# Patient Record
Sex: Female | Born: 1985 | Race: White | Hispanic: No | Marital: Single | Smoking: Never smoker
Health system: Southern US, Community
[De-identification: ages and names within clinical notes are randomized; demographics above are authoritative.]

## PROBLEM LIST (undated history)

## (undated) DIAGNOSIS — K4090 Unilateral inguinal hernia, without obstruction or gangrene, not specified as recurrent: Secondary | ICD-10-CM

## (undated) HISTORY — PX: WISDOM TOOTH EXTRACTION: SHX21

## (undated) HISTORY — PX: INGUINAL HERNIA REPAIR: SHX194

## (undated) HISTORY — DX: Unilateral inguinal hernia, without obstruction or gangrene, not specified as recurrent: K40.90

---

## 2007-05-18 ENCOUNTER — Emergency Department (HOSPITAL_COMMUNITY): Admission: EM | Admit: 2007-05-18 | Discharge: 2007-05-19 | Payer: Self-pay | Admitting: Emergency Medicine

## 2008-05-25 ENCOUNTER — Other Ambulatory Visit: Admission: RE | Admit: 2008-05-25 | Discharge: 2008-05-25 | Payer: Self-pay | Admitting: Obstetrics & Gynecology

## 2010-10-29 LAB — RAPID STREP SCREEN (MED CTR MEBANE ONLY): Streptococcus, Group A Screen (Direct): NEGATIVE

## 2012-12-01 ENCOUNTER — Encounter: Payer: Self-pay | Admitting: Obstetrics and Gynecology

## 2012-12-02 ENCOUNTER — Ambulatory Visit: Payer: Self-pay | Admitting: Nurse Practitioner

## 2012-12-02 ENCOUNTER — Encounter: Payer: Self-pay | Admitting: Nurse Practitioner

## 2012-12-16 ENCOUNTER — Telehealth: Payer: Self-pay | Admitting: Obstetrics and Gynecology
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
## 2014-11-23 ENCOUNTER — Encounter (HOSPITAL_COMMUNITY): Payer: Self-pay | Admitting: Emergency Medicine

## 2014-11-23 ENCOUNTER — Emergency Department (INDEPENDENT_AMBULATORY_CARE_PROVIDER_SITE_OTHER)
Admission: EM | Admit: 2014-11-23 | Discharge: 2014-11-23 | Disposition: A | Discharge status: Home / Self Care | Payer: Self-pay | Source: Home / Self Care | Attending: Family Medicine | Admitting: Family Medicine

## 2014-11-23 DIAGNOSIS — R51 Headache: Secondary | ICD-10-CM

## 2014-11-23 DIAGNOSIS — T148XXA Other injury of unspecified body region, initial encounter: Secondary | ICD-10-CM

## 2014-11-23 DIAGNOSIS — T148 Other injury of unspecified body region: Secondary | ICD-10-CM

## 2014-11-23 DIAGNOSIS — R519 Headache, unspecified: Secondary | ICD-10-CM

## 2014-11-23 MED ORDER — METHOCARBAMOL 500 MG PO TABS: 500.0000 mg | ORAL_TABLET | Freq: Four times a day (QID) | ORAL | Status: DC | PRN
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
## 2016-02-29 ENCOUNTER — Encounter (HOSPITAL_COMMUNITY): Payer: Self-pay | Admitting: Emergency Medicine

## 2016-02-29 ENCOUNTER — Inpatient Hospital Stay (HOSPITAL_COMMUNITY)
Admission: EM | Admit: 2016-02-29 | Discharge: 2016-03-02 | DRG: 872 | Disposition: A | Discharge status: Home / Self Care | Payer: BLUE CROSS/BLUE SHIELD | Attending: Internal Medicine | Admitting: Internal Medicine

## 2016-02-29 ENCOUNTER — Emergency Department (HOSPITAL_COMMUNITY): Payer: BLUE CROSS/BLUE SHIELD

## 2016-02-29 DIAGNOSIS — Z808 Family history of malignant neoplasm of other organs or systems: Secondary | ICD-10-CM

## 2016-02-29 DIAGNOSIS — D61818 Other pancytopenia: Secondary | ICD-10-CM | POA: Diagnosis present

## 2016-02-29 DIAGNOSIS — A419 Sepsis, unspecified organism: Principal | ICD-10-CM

## 2016-02-29 DIAGNOSIS — E876 Hypokalemia: Secondary | ICD-10-CM | POA: Diagnosis present

## 2016-02-29 DIAGNOSIS — J09X2 Influenza due to identified novel influenza A virus with other respiratory manifestations: Secondary | ICD-10-CM | POA: Diagnosis present

## 2016-02-29 DIAGNOSIS — Z91040 Latex allergy status: Secondary | ICD-10-CM

## 2016-02-29 DIAGNOSIS — Z9889 Other specified postprocedural states: Secondary | ICD-10-CM

## 2016-02-29 DIAGNOSIS — J111 Influenza due to unidentified influenza virus with other respiratory manifestations: Secondary | ICD-10-CM | POA: Diagnosis not present

## 2016-02-29 DIAGNOSIS — J101 Influenza due to other identified influenza virus with other respiratory manifestations: Secondary | ICD-10-CM | POA: Diagnosis present

## 2016-02-29 DIAGNOSIS — I959 Hypotension, unspecified: Secondary | ICD-10-CM | POA: Diagnosis present

## 2016-02-29 DIAGNOSIS — Z79899 Other long term (current) drug therapy: Secondary | ICD-10-CM

## 2016-02-29 LAB — I-STAT CG4 LACTIC ACID, ED: LACTIC ACID, VENOUS: 1.72 mmol/L (ref 0.5–1.9)

## 2016-02-29 MED ORDER — ONDANSETRON 4 MG PO TBDP
4.0000 mg | ORAL_TABLET | Freq: Once | ORAL | Status: AC | PRN
  Administered 2016-02-29: 4 mg via ORAL

## 2016-02-29 MED ORDER — ONDANSETRON 4 MG PO TBDP
ORAL_TABLET | ORAL | Status: AC
  Filled 2016-02-29: qty 1

## 2016-02-29 MED ORDER — ACETAMINOPHEN 325 MG PO TABS
650.0000 mg | ORAL_TABLET | Freq: Once | ORAL | Status: AC | PRN
  Administered 2016-02-29: 650 mg via ORAL

## 2016-02-29 MED ORDER — ACETAMINOPHEN 325 MG PO TABS
ORAL_TABLET | ORAL | Status: AC
  Administered 2016-02-29: 650 mg via ORAL
  Filled 2016-02-29: qty 2

## 2016-02-29 NOTE — ED Triage Notes (Signed)
Arrives via EMS with complaint of influenza related symptoms. Has been with fever up to 103.1 tonight at 2223. Attempted to take tylenol and then vomited. Fever remains @ 103 on arrival here.

## 2016-02-29 NOTE — ED Notes (Signed)
Pt going to xray then will go to exam room

## 2016-03-01 DIAGNOSIS — Z79899 Other long term (current) drug therapy: Secondary | ICD-10-CM | POA: Diagnosis not present

## 2016-03-01 DIAGNOSIS — D61818 Other pancytopenia: Secondary | ICD-10-CM | POA: Diagnosis present

## 2016-03-01 DIAGNOSIS — Z808 Family history of malignant neoplasm of other organs or systems: Secondary | ICD-10-CM | POA: Diagnosis not present

## 2016-03-01 DIAGNOSIS — J101 Influenza due to other identified influenza virus with other respiratory manifestations: Secondary | ICD-10-CM | POA: Diagnosis not present

## 2016-03-01 DIAGNOSIS — I959 Hypotension, unspecified: Secondary | ICD-10-CM | POA: Diagnosis present

## 2016-03-01 DIAGNOSIS — Z9889 Other specified postprocedural states: Secondary | ICD-10-CM | POA: Diagnosis not present

## 2016-03-01 DIAGNOSIS — E876 Hypokalemia: Secondary | ICD-10-CM | POA: Diagnosis present

## 2016-03-01 DIAGNOSIS — A419 Sepsis, unspecified organism: Secondary | ICD-10-CM | POA: Diagnosis present

## 2016-03-01 DIAGNOSIS — Z91040 Latex allergy status: Secondary | ICD-10-CM | POA: Diagnosis not present

## 2016-03-01 DIAGNOSIS — J09X2 Influenza due to identified novel influenza A virus with other respiratory manifestations: Secondary | ICD-10-CM | POA: Diagnosis present

## 2016-03-01 DIAGNOSIS — J111 Influenza due to unidentified influenza virus with other respiratory manifestations: Secondary | ICD-10-CM | POA: Diagnosis present

## 2016-03-01 LAB — URINALYSIS, ROUTINE W REFLEX MICROSCOPIC
Bilirubin Urine: NEGATIVE
GLUCOSE, UA: NEGATIVE mg/dL
KETONES UR: 5 mg/dL — AB
Leukocytes, UA: NEGATIVE
Nitrite: NEGATIVE
PROTEIN: NEGATIVE mg/dL
Specific Gravity, Urine: 1.005 (ref 1.005–1.030)
pH: 8 (ref 5.0–8.0)

## 2016-03-01 LAB — CBC
HCT: 30.6 % — ABNORMAL LOW (ref 36.0–46.0)
HCT: 39.4 % (ref 36.0–46.0)
HEMOGLOBIN: 13 g/dL (ref 12.0–15.0)
Hemoglobin: 10 g/dL — ABNORMAL LOW (ref 12.0–15.0)
MCH: 30.2 pg (ref 26.0–34.0)
MCH: 30.4 pg (ref 26.0–34.0)
MCHC: 32.7 g/dL (ref 30.0–36.0)
MCHC: 33 g/dL (ref 30.0–36.0)
MCV: 91.6 fL (ref 78.0–100.0)
MCV: 93 fL (ref 78.0–100.0)
Platelets: 116 10*3/uL — ABNORMAL LOW (ref 150–400)
Platelets: 84 10*3/uL — ABNORMAL LOW (ref 150–400)
RBC: 3.29 MIL/uL — ABNORMAL LOW (ref 3.87–5.11)
RBC: 4.3 MIL/uL (ref 3.87–5.11)
RDW: 12.5 % (ref 11.5–15.5)
RDW: 12.6 % (ref 11.5–15.5)
WBC: 3.2 10*3/uL — ABNORMAL LOW (ref 4.0–10.5)
WBC: 4.9 10*3/uL (ref 4.0–10.5)

## 2016-03-01 LAB — BASIC METABOLIC PANEL
Anion gap: 5 (ref 5–15)
Anion gap: 6 (ref 5–15)
BUN: <5 mg/dL — ABNORMAL LOW (ref 6–20)
CALCIUM: 6.3 mg/dL — AB (ref 8.9–10.3)
CALCIUM: 7.4 mg/dL — AB (ref 8.9–10.3)
CO2: 21 mmol/L — ABNORMAL LOW (ref 22–32)
CO2: 21 mmol/L — AB (ref 22–32)
CREATININE: 0.56 mg/dL (ref 0.44–1.00)
CREATININE: 0.55 mg/dL (ref 0.44–1.00)
Chloride: 116 mmol/L — ABNORMAL HIGH (ref 101–111)
Chloride: 112 mmol/L — ABNORMAL HIGH (ref 101–111)
GFR calc Af Amer: >60 mL/min (ref >60)
GFR calc non Af Amer: >60 mL/min (ref >60)
GLUCOSE: 89 mg/dL (ref 65–99)
Glucose, Bld: 94 mg/dL (ref 65–99)
Potassium: 3.3 mmol/L — ABNORMAL LOW (ref 3.5–5.1)
Potassium: 3.7 mmol/L (ref 3.5–5.1)
SODIUM: 142 mmol/L (ref 135–145)
Sodium: 139 mmol/L (ref 135–145)

## 2016-03-01 LAB — I-STAT BETA HCG BLOOD, ED (MC, WL, AP ONLY): I-stat hCG, quantitative: <5 m[IU]/mL (ref <5)

## 2016-03-01 LAB — COMPREHENSIVE METABOLIC PANEL
ALK PHOS: 49 U/L (ref 38–126)
ALT: 16 U/L (ref 14–54)
ANION GAP: 12 (ref 5–15)
AST: 24 U/L (ref 15–41)
Albumin: 4.4 g/dL (ref 3.5–5.0)
BILIRUBIN TOTAL: 0.8 mg/dL (ref 0.3–1.2)
BUN: <5 mg/dL — ABNORMAL LOW (ref 6–20)
CALCIUM: 8.8 mg/dL — AB (ref 8.9–10.3)
CO2: 23 mmol/L (ref 22–32)
Chloride: 102 mmol/L (ref 101–111)
Creatinine, Ser: 0.75 mg/dL (ref 0.44–1.00)
GFR calc non Af Amer: >60 mL/min (ref >60)
Glucose, Bld: 113 mg/dL — ABNORMAL HIGH (ref 65–99)
Potassium: 3.5 mmol/L (ref 3.5–5.1)
SODIUM: 137 mmol/L (ref 135–145)
TOTAL PROTEIN: 7.8 g/dL (ref 6.5–8.1)

## 2016-03-01 LAB — ALBUMIN: Albumin: 3.1 g/dL — ABNORMAL LOW (ref 3.5–5.0)

## 2016-03-01 MED ORDER — FAMOTIDINE 20 MG PO TABS
20.0000 mg | ORAL_TABLET | Freq: Every day | ORAL | Status: DC
  Administered 2016-03-01 – 2016-03-02 (×2): 20 mg via ORAL
  Filled 2016-03-01 (×2): qty 1

## 2016-03-01 MED ORDER — MAGNESIUM OXIDE 400 (241.3 MG) MG PO TABS
400.0000 mg | ORAL_TABLET | Freq: Two times a day (BID) | ORAL | Status: DC
  Administered 2016-03-01: 400 mg via ORAL
  Filled 2016-03-01: qty 1

## 2016-03-01 MED ORDER — OSELTAMIVIR PHOSPHATE 75 MG PO CAPS: 75.0000 mg | ORAL_CAPSULE | Freq: Two times a day (BID) | ORAL | Status: DC

## 2016-03-01 MED ORDER — SODIUM CHLORIDE 0.9 % IV SOLN
1.0000 g | Freq: Once | INTRAVENOUS | Status: AC
  Administered 2016-03-01: 1 g via INTRAVENOUS
  Filled 2016-03-01: qty 10

## 2016-03-01 MED ORDER — ONDANSETRON 4 MG PO TBDP: 4.0000 mg | ORAL_TABLET | Freq: Three times a day (TID) | ORAL | 0 refills | Status: AC | PRN

## 2016-03-01 MED ORDER — SODIUM CHLORIDE 0.9 % IV BOLUS (SEPSIS)
1000.0000 mL | Freq: Once | INTRAVENOUS | Status: AC
  Administered 2016-03-01: 1000 mL via INTRAVENOUS

## 2016-03-01 MED ORDER — FAMOTIDINE 20 MG PO TABS: 20.0000 mg | ORAL_TABLET | Freq: Every day | ORAL | 0 refills | Status: AC

## 2016-03-01 MED ORDER — CALCIUM CARBONATE ANTACID 500 MG PO CHEW
1.0000 | CHEWABLE_TABLET | Freq: Three times a day (TID) | ORAL | Status: DC
  Administered 2016-03-01 – 2016-03-02 (×2): 200 mg via ORAL
  Filled 2016-03-01 (×2): qty 1

## 2016-03-01 MED ORDER — SODIUM CHLORIDE 0.9% FLUSH
3.0000 mL | Freq: Two times a day (BID) | INTRAVENOUS | Status: DC
  Administered 2016-03-01 – 2016-03-02 (×3): 3 mL via INTRAVENOUS

## 2016-03-01 MED ORDER — ONDANSETRON HCL 4 MG/2ML IJ SOLN
4.0000 mg | Freq: Four times a day (QID) | INTRAMUSCULAR | Status: DC | PRN
  Administered 2016-03-01 – 2016-03-02 (×3): 4 mg via INTRAVENOUS
  Filled 2016-03-01 (×3): qty 2

## 2016-03-01 MED ORDER — IBUPROFEN 800 MG PO TABS
800.0000 mg | ORAL_TABLET | Freq: Once | ORAL | Status: AC
  Administered 2016-03-01: 800 mg via ORAL
  Filled 2016-03-01: qty 1

## 2016-03-01 MED ORDER — ALBUTEROL SULFATE (2.5 MG/3ML) 0.083% IN NEBU: 2.5000 mg | INHALATION_SOLUTION | RESPIRATORY_TRACT | Status: DC | PRN

## 2016-03-01 MED ORDER — MAGNESIUM OXIDE 400 (241.3 MG) MG PO TABS: 400.0000 mg | ORAL_TABLET | Freq: Two times a day (BID) | ORAL | Status: DC

## 2016-03-01 MED ORDER — BENZONATATE 200 MG PO CAPS: 200.0000 mg | ORAL_CAPSULE | Freq: Three times a day (TID) | ORAL | 0 refills | Status: AC | PRN

## 2016-03-01 MED ORDER — GUAIFENESIN ER 600 MG PO TB12: 600.0000 mg | ORAL_TABLET | Freq: Two times a day (BID) | ORAL | 0 refills | Status: AC

## 2016-03-01 MED ORDER — SODIUM CHLORIDE 0.9 % IV SOLN
INTRAVENOUS | Status: DC
  Administered 2016-03-01: 07:00:00 via INTRAVENOUS
  Administered 2016-03-01: 100 mL/h via INTRAVENOUS

## 2016-03-01 MED ORDER — BENZONATATE 100 MG PO CAPS: 200.0000 mg | ORAL_CAPSULE | Freq: Three times a day (TID) | ORAL | Status: DC | PRN

## 2016-03-01 MED ORDER — CALCIUM CARBONATE ANTACID 500 MG PO CHEW: 1.0000 | CHEWABLE_TABLET | Freq: Three times a day (TID) | ORAL | Status: DC

## 2016-03-01 MED ORDER — POTASSIUM CHLORIDE CRYS ER 20 MEQ PO TBCR
40.0000 meq | EXTENDED_RELEASE_TABLET | Freq: Once | ORAL | Status: AC
  Administered 2016-03-01: 40 meq via ORAL
  Filled 2016-03-01: qty 2

## 2016-03-01 MED ORDER — OSELTAMIVIR PHOSPHATE 75 MG PO CAPS
75.0000 mg | ORAL_CAPSULE | Freq: Two times a day (BID) | ORAL | Status: DC
Start: 2016-03-01 — End: 2016-03-02
  Administered 2016-03-01 – 2016-03-02 (×3): 75 mg via ORAL
  Filled 2016-03-01 (×4): qty 1

## 2016-03-01 MED ORDER — ACETAMINOPHEN 325 MG PO TABS
650.0000 mg | ORAL_TABLET | Freq: Four times a day (QID) | ORAL | Status: DC | PRN
  Administered 2016-03-01: 650 mg via ORAL
  Filled 2016-03-01: qty 2

## 2016-03-01 MED ORDER — SODIUM CHLORIDE 0.9 % IV BOLUS (SEPSIS)
1000.0000 mL | INTRAVENOUS | Status: AC
  Administered 2016-03-01: 1000 mL via INTRAVENOUS

## 2016-03-01 MED ORDER — GUAIFENESIN ER 600 MG PO TB12
600.0000 mg | ORAL_TABLET | Freq: Two times a day (BID) | ORAL | Status: DC
  Administered 2016-03-01 – 2016-03-02 (×3): 600 mg via ORAL
  Filled 2016-03-01 (×3): qty 1

## 2016-03-01 MED ORDER — IBUPROFEN 400 MG PO TABS: 400.0000 mg | ORAL_TABLET | Freq: Four times a day (QID) | ORAL | Status: DC | PRN

## 2016-03-01 NOTE — Progress Notes (Addendum)
Feels much better No further vomiting Awake/alert BP now stable About to try clear liquids for breakfast  On Exam:  Chest-clear to auscultation Abd-soft, non tender Ext: no edema Neuro: non focal Skin: no rash-dry  Labs: WBC 3.2 Hb 10.0 Platelet: 84  Imp: Influenza Hypotension due to hypovolumia Mild leukopenia/thrombocytopenia to due to viral illness-should improve with supportive care and time  Plan: Stop IVF Advance diet Continue Tamiflu Ambulate/mobilize If does well-discharge home by the end of the day Patient will follow with her PCP on 1/29 for repeat CBC Spoke with patient's mother over the phone  Total time spent 25 minutes  Addendum: Lytes just came back-calcium down to 6.3-will give one dose of Calcium gluconate-repeat lyes with albumin later today.

## 2016-03-01 NOTE — ED Provider Notes (Signed)
Patient signed out to me at shift change.  Diagnosed with flu today by PCP.  Came to ED for fever.  Noted to be hypotensive.  Still hypotensive after 2 liters.  Getting her 3rd now.   Alert, oriented, states she is feeling better.  No evidence of end organ damage.  Appreciate Dr. Montez Moritaarter for admitting the patient to stepdown.  CRITICAL CARE Performed by: Roxy HorsemanBROWNING, Guerry Covington   Total critical care time: 35 minutes  Critical care time was exclusive of separately billable procedures and treating other patients.  Critical care was necessary to treat or prevent imminent or life-threatening deterioration.  Critical care was time spent personally by me on the following activities: development of treatment plan with patient and/or surrogate as well as nursing, discussions with consultants, evaluation of patient's response to treatment, examination of patient, obtaining history from patient or surrogate, ordering and performing treatments and interventions, ordering and review of laboratory studies, ordering and review of radiographic studies, pulse oximetry and re-evaluation of patient's condition.    Roxy Horsemanobert Kelisha Dall, PA-C 03/01/16 [id number]    Dione Boozeavid Glick, MD 03/01/16 [phone]

## 2016-03-01 NOTE — ED Notes (Signed)
Pt presents with fever and flu-like symptoms. [facility] Pt reports having a little cough and sore throat when all these symptoms started today.

## 2016-03-01 NOTE — ED Provider Notes (Signed)
[facility]-EMERGENCY DEPT Provider Note   CSN: [id number] Arrival date & time: 02/29/16  2313     History   Chief Complaint Chief Complaint  Patient presents with  . Influenza    HPI Sandy Spencer Spencer is a 31 y.o. female.  HPI Sandy Spencer Spencer is a 31 y.o. female presents to emergency department with flulike symptoms. Patient states her symptoms started yesterday. States started running fever this evening. She reports nasal congestion, cough, sore throat, body aches, nausea, one episode of vomiting this evening. She went to her primary care doctor today who tested her positive for influenza A and she was given Tamiflu. She had first dose of Tamiflu today. She states later this evening she has developed fever and chills. She states fever climbed up to 103 which prompted her to come to the emergency department. Patient denies any pain at this time. She denies any headache or neck pain. No abdominal pain or chest pain. She denies any diarrhea. She did have one episode of emesis but is able to keep fluids down. She reports no appetite and generalized malaise. She states her mother is sick with similar symptoms.  Past Medical History:  Diagnosis Date  . Inguinal hernia infant   left    There are no active problems to display for this patient.   Past Surgical History:  Procedure Laterality Date  . INGUINAL HERNIA REPAIR Left infant  . WISDOM TOOTH EXTRACTION      OB History    Gravida Para Term Preterm AB Living   0 0 0 0 0 0   SAB TAB Ectopic Multiple Live Births   0 0 0 0         Home Medications    Prior to Admission medications   Medication Sig Start Date End Date Taking? Authorizing Provider  methocarbamol (ROBAXIN) 500 MG tablet Take 1-2 tablets (500-1,000 mg total) by mouth every 6 (six) hours as needed for muscle spasms. 11/23/14   Ozella Rocksavid J Merrell, MD    Family History Family History  Problem Relation Age of Onset  . Melanoma Mother     Social History Social History    Substance Use Topics  . Smoking status: Never Smoker  . Smokeless tobacco: Never Used  . Alcohol use Yes     Allergies   Latex   Review of Systems Review of Systems  Constitutional: Positive for chills and fever.  HENT: Positive for congestion and sore throat.   Respiratory: Positive for cough. Negative for chest tightness and shortness of breath.   Cardiovascular: Negative for chest pain, palpitations and leg swelling.  Gastrointestinal: Negative for abdominal pain, diarrhea, nausea and vomiting.  Genitourinary: Negative for dysuria, flank pain, pelvic pain, vaginal bleeding, vaginal discharge and vaginal pain.  Musculoskeletal: Negative for arthralgias, myalgias, neck pain and neck stiffness.  Skin: Negative for rash.  Neurological: Positive for headaches. Negative for dizziness and weakness.  All other systems reviewed and are negative.    Physical Exam Updated Vital Signs BP (!) 85/61 (BP Location: Right Arm)   Pulse (!) 124   Temp 103 F (39.4 C) (Oral)   Resp 20   Ht 5\' 3"  (1.6 m)   Wt 54.5 kg   LMP 02/17/2016 (Exact Date)   SpO2 100%   BMI 21.28 kg/m   Physical Exam  Constitutional: She is oriented to person, place, and time. She appears well-developed and well-nourished. No distress.  HENT:  Head: Normocephalic and atraumatic.  Right Ear: Tympanic membrane, external ear and  ear canal normal.  Left Ear: Tympanic membrane, external ear and ear canal normal.  Nose: Mucosal edema and rhinorrhea present.  Mouth/Throat: Uvula is midline and mucous membranes are normal. Posterior oropharyngeal erythema present. No oropharyngeal exudate, posterior oropharyngeal edema or tonsillar abscesses.  Eyes: Conjunctivae are normal.  Neck: Neck supple.  Cardiovascular: Normal rate, regular rhythm, normal heart sounds and intact distal pulses.   Pulmonary/Chest: Effort normal and breath sounds normal. No respiratory distress. She has no wheezes. She has no rales.  Abdominal:  She exhibits no distension.  Musculoskeletal: Normal range of motion.  Neurological: She is alert and oriented to person, place, and time.  Skin: Skin is warm and dry.  Psychiatric: She has a normal mood and affect.  Nursing note and vitals reviewed.    ED Treatments / Results  Labs (all labs ordered are listed, but only abnormal results are displayed) Labs Reviewed  COMPREHENSIVE METABOLIC PANEL  CBC  I-STAT CG4 LACTIC ACID, ED  I-STAT BETA HCG BLOOD, ED ([facility], [facility], [facility] ONLY)    EKG  EKG Interpretation None       Radiology Dg Chest 2 View  Result Date: 03/01/2016 CLINICAL DATA:  31 year old female with cough and congestion and fever. EXAM: CHEST  2 VIEW COMPARISON:  Chest radiograph dated 01/08/2012 FINDINGS: Two views of the chest demonstrate clear lungs with sharp costophrenic angles. There is no pneumothorax. The cardiac silhouette is within normal limits. No acute osseous pathology. IMPRESSION: No active cardiopulmonary disease. Electronically Signed   By: Elgie Collard M.D.   On: 03/01/2016 00:04    Procedures Procedures (including critical care time)  Medications Ordered in ED Medications  sodium chloride 0.9 % bolus 1,000 mL (not administered)  sodium chloride 0.9 % bolus 1,000 mL (not administered)  ibuprofen (ADVIL,MOTRIN) tablet 800 mg (not administered)  ondansetron (ZOFRAN-ODT) disintegrating tablet 4 mg (4 mg Oral Given 02/29/16 2334)  acetaminophen (TYLENOL) tablet 650 mg (650 mg Oral Given 02/29/16 2347)     Initial Impression / Assessment and Plan / ED Course  I have reviewed the triage vital signs and the nursing notes.  Pertinent labs & imaging results that were available during my care of the patient were reviewed by me and considered in my medical decision making (see chart for details).     Patient in emergency department with influenza. Patient is hypotensive with blood pressure between 80 and 96 systolic. She is febrile. She is tachycardic.  Will start IV fluids. Will check basic labs and chest x-ray. Patient is nontoxic appearing otherwise.   Labs unremarkable, fluids are running. Patient signed out to [facility] [place] pending fluid bolus and reassessment. If continues to be hypertensive will need admission.     Final Clinical Impressions(s) / ED Diagnoses   Final diagnoses:  None    New Prescriptions New Prescriptions   No medications on file     Jaynie Crumble, PA-C 03/02/16 0051    Courteney Lyn Mackuen, MD 03/02/16 6213

## 2016-03-01 NOTE — H&P (Addendum)
History and Physical    Sandy Spencer BJY:[id number] DOB: 07-20-1985 DOA: 02/29/2016  PCP: PROVIDER NOT IN SYSTEM   Patient coming from: Home  Chief Complaint: Fever, nausea, vomiting  HPI: Sandy Spencer is a 31 y.o. woman with no significant past medical history who was in her baseline state of health until approximately 24 hours prior to presentation.  She saw her PCP on Friday for new fever, dry cough, and weakness.  She had a single episode of nausea/vomitng after trying to take acetaminophen for her fever.  She denies chills or sweats.  No LHD or syncope.  She was diagnosed with influenza A today and was given a prescription for Tamiflu.  She took the first dose Friday evening.  She spiked a fever to 103 after seeing her PCP, and her heart rates shot up to the 130's.  This concerned her mother (who is currently in [place] but has also had the flu this season) and her grandmother (who is here in [place]).  The patient presented to the ED and was found to have relative hypotension with systolic blood pressures in the 70's, even after 2L of IV fluids.  No headache, dizziness, chest pain, shortness of breath.  No O2 requirement.  ED Course: Lactic acid level 1.72.  Negative pregnancy test.  Chest xray negative for acute process.  Systolic blood pressures were still relapsing into the 70's after 2L of NS.  Hospitalist asked to admit.  Third liter of fluid started at the time of admission.  Review of Systems: As per HPI otherwise 10 point review of systems negative.    Past Medical History:  Diagnosis Date  . Inguinal hernia infant   left    Past Surgical History:  Procedure Laterality Date  . INGUINAL HERNIA REPAIR Left infant  . WISDOM TOOTH EXTRACTION       reports that she has never smoked. She has never used smokeless tobacco. She reports that she drinks alcohol. She reports that she does not use drugs.  She is not married.  She does not have children.  She is a Consulting civil engineer at  [employer]. Social EtOH.  Allergies  Allergen Reactions  . Latex     Family History  Problem Relation Age of Onset  . Melanoma Mother      Prior to Admission medications   Medication Sig Start Date End Date Taking? Authorizing Provider  Cholecalciferol (VITAMIN D PO) Take 1 tablet by mouth daily.   Yes Historical Provider, MD  Thiamine HCl (VITAMIN B-1 PO) Take 1 tablet by mouth daily.   Yes Historical Provider, MD    Physical Exam: Vitals:   03/01/16 0230 03/01/16 0304 03/01/16 0309 03/01/16 0315  BP: (!) 83/66 (!) 79/57 105/66 103/70  Pulse: 105 98 98 95  Resp: (!) 27 24 22 19   Temp:      TempSrc:      SpO2: 99% 97% 100% 99%  Weight:      Height:          Constitutional: NAD, calm, comfortable Vitals:   03/01/16 0230 03/01/16 0304 03/01/16 0309 03/01/16 0315  BP: (!) 83/66 (!) 79/57 105/66 103/70  Pulse: 105 98 98 95  Resp: (!) 27 24 22 19   Temp:      TempSrc:      SpO2: 99% 97% 100% 99%  Weight:      Height:       Eyes: PERRL, lids and conjunctivae normal ENMT: She is wearing a mask, which I did not  remove. Neck: normal appearance, supple Respiratory: clear to auscultation bilaterally, no wheezing, no crackles. Normal respiratory effort. No accessory muscle use.  Cardiovascular: Mildly tachycardic at the time of my exam but regular rhythm, no murmurs / rubs / gallops. No extremity edema. 2+ pedal pulses. GI: abdomen is soft and compressible.  No distention.  No tenderness.  Bowel sounds are present. Musculoskeletal:  No joint deformity in upper and lower extremities. Good ROM, no contractures. Normal muscle tone.  Skin: no rashes, warm and dry Neurologic: No focal deficits. Psychiatric: Normal judgment and insight. Alert and oriented x 3. Normal mood.     Labs on Admission: I have personally reviewed following labs and imaging studies  CBC:  Recent Labs Lab 02/29/16 2342  WBC 4.9  HGB 13.0  HCT 39.4  MCV 91.6  PLT 116*   Basic Metabolic  Panel:  Recent Labs Lab 02/29/16 2342  NA 137  K 3.5  CL 102  CO2 23  GLUCOSE 113*  BUN <5*  CREATININE 0.75  CALCIUM 8.8*   GFR: Estimated Creatinine Clearance: 86.6 mL/min (by C-G formula based on SCr of 0.75 mg/dL). Liver Function Tests:  Recent Labs Lab 02/29/16 2342  AST 24  ALT 16  ALKPHOS 49  BILITOT 0.8  PROT 7.8  ALBUMIN 4.4   Urine analysis:    Component Value Date/Time   COLORURINE STRAW (A) 03/01/2016 0152   APPEARANCEUR CLEAR 03/01/2016 0152   LABSPEC 1.005 03/01/2016 0152   PHURINE 8.0 03/01/2016 0152   GLUCOSEU NEGATIVE 03/01/2016 0152   HGBUR SMALL (A) 03/01/2016 0152   BILIRUBINUR NEGATIVE 03/01/2016 0152   KETONESUR 5 (A) 03/01/2016 0152   PROTEINUR NEGATIVE 03/01/2016 0152   NITRITE NEGATIVE 03/01/2016 0152   LEUKOCYTESUR NEGATIVE 03/01/2016 0152   Sepsis Labs:  Lactic acid level 1.72.  Radiological Exams on Admission: Dg Chest 2 View  Result Date: 03/01/2016 CLINICAL DATA:  31 year old female with cough and congestion and fever. EXAM: CHEST  2 VIEW COMPARISON:  Chest radiograph dated 01/08/2012 FINDINGS: Two views of the chest demonstrate clear lungs with sharp costophrenic angles. There is no pneumothorax. The cardiac silhouette is within normal limits. No acute osseous pathology. IMPRESSION: No active cardiopulmonary disease. Electronically Signed   By: Elgie Collard M.D.   On: 03/01/2016 00:04    Assessment/Plan Principal Problem:   Influenza A Active Problems:   Sepsis (HCC)      Sepsis with hypotension secondary to acute influenza A --continue NS at 100cc/hr after third bolus --Low threshold for PCCM consult for refractory hypotension --Tamiflu 75mg  BID, next dose in the AM --Clear liquid diet; advance as tolerated --Analgesics and anti-emetics as needed.   DVT prophylaxis: Low risk, early ambulation Code Status: FULL Family Communication: Spoke with patient's mother by phone and grandmother at bedside. Disposition  Plan: Expect she will go home when hemodynamically stable and tolerating PO. Consults called: NONE Admission status: Inpatient, patient originally accepted to the stepdown unit because she appeared critically ill with sepsis and hypotension that appeared to be refractory to aggressive volume resuscitation.  She was felt to be a potential candidate for pressor support, which was discussed with her mother by phone.  At the time of admission, I expected she would need inpatient services for at least two midnights, based on her severity of illness.  After the third liter of NS, she has demonstrated unexpected rapid improvement.  I have been asked to reassess her bed assignment.    Mother's cell phone number [phone] Grandmother's cell  phone number [phone]  Patient's mother would like to be notified for change in clinical status.   TIME SPENT: 70 minutes   Jerene Bearsarter,Jewelia Bocchino Harrison MD [facility] Pager [phone]  If 7PM-7AM, please contact night-coverage [email or web address] Password [email or web address]  03/01/2016, 4:00 AM

## 2016-03-02 DIAGNOSIS — I959 Hypotension, unspecified: Secondary | ICD-10-CM

## 2016-03-02 DIAGNOSIS — E876 Hypokalemia: Secondary | ICD-10-CM

## 2016-03-02 LAB — BASIC METABOLIC PANEL
Anion gap: 7 (ref 5–15)
CHLORIDE: 107 mmol/L (ref 101–111)
CO2: 26 mmol/L (ref 22–32)
CREATININE: 0.74 mg/dL (ref 0.44–1.00)
Calcium: 8 mg/dL — ABNORMAL LOW (ref 8.9–10.3)
GFR calc Af Amer: >60 mL/min (ref >60)
GFR calc non Af Amer: >60 mL/min (ref >60)
GLUCOSE: 99 mg/dL (ref 65–99)
Potassium: 3.3 mmol/L — ABNORMAL LOW (ref 3.5–5.1)
Sodium: 140 mmol/L (ref 135–145)

## 2016-03-02 LAB — CBC
HEMATOCRIT: 34.5 % — AB (ref 36.0–46.0)
HEMOGLOBIN: 11.1 g/dL — AB (ref 12.0–15.0)
MCH: 29.8 pg (ref 26.0–34.0)
MCHC: 32.2 g/dL (ref 30.0–36.0)
MCV: 92.5 fL (ref 78.0–100.0)
Platelets: 100 10*3/uL — ABNORMAL LOW (ref 150–400)
RBC: 3.73 MIL/uL — ABNORMAL LOW (ref 3.87–5.11)
RDW: 12.8 % (ref 11.5–15.5)
WBC: 3.5 10*3/uL — ABNORMAL LOW (ref 4.0–10.5)

## 2016-03-02 LAB — MAGNESIUM: Magnesium: 1.8 mg/dL (ref 1.7–2.4)

## 2016-03-02 LAB — ALBUMIN: Albumin: 3.3 g/dL — ABNORMAL LOW (ref 3.5–5.0)

## 2016-03-02 MED ORDER — OSELTAMIVIR PHOSPHATE 75 MG PO CAPS: 75.0000 mg | ORAL_CAPSULE | Freq: Two times a day (BID) | ORAL | Status: AC

## 2016-03-02 MED ORDER — SODIUM CHLORIDE 0.9 % IV SOLN
1.0000 g | Freq: Once | INTRAVENOUS | Status: AC
  Administered 2016-03-02: 1 g via INTRAVENOUS
  Filled 2016-03-02: qty 10

## 2016-03-02 MED ORDER — MAGNESIUM SULFATE 2 GM/50ML IV SOLN
2.0000 g | Freq: Once | INTRAVENOUS | Status: AC
  Administered 2016-03-02: 2 g via INTRAVENOUS
  Filled 2016-03-02: qty 50

## 2016-03-02 MED ORDER — CALCIUM CARBONATE ANTACID 500 MG PO CHEW: 1.0000 | CHEWABLE_TABLET | Freq: Three times a day (TID) | ORAL | 0 refills | Status: AC

## 2016-03-02 MED ORDER — POTASSIUM CHLORIDE CRYS ER 20 MEQ PO TBCR
40.0000 meq | EXTENDED_RELEASE_TABLET | Freq: Once | ORAL | Status: AC
  Administered 2016-03-02: 40 meq via ORAL
  Filled 2016-03-02: qty 2

## 2016-03-02 NOTE — Progress Notes (Signed)
NURSING PROGRESS NOTE  Sandy CousinLauren Spencer [id number] Discharge Data: 03/02/2016 12:04 PM Attending Provider: Maretta BeesShanker M Ghimire, MD FAO:ZHYQM,VHQIONPCP:GATES,ROBERT NEVILL, MD     Sandy Spencer to be D/C'd Home per MD order.  Discussed with the patient the After Visit Summary and all questions fully answered. All IV's discontinued with no bleeding noted. All belongings returned to patient for patient to take home.   Last Vital Signs:  Blood pressure 97/66, pulse 75, temperature 98.1 F (36.7 C), temperature source Oral, resp. rate 18, height 5\' 3"  (1.6 m), weight 57.6 kg (126 lb 15.8 oz), last menstrual period 02/17/2016, SpO2 99 %.  Discharge Medication List Allergies as of 03/02/2016      Reactions   Latex       Medication List    STOP taking these medications   VITAMIN B-1 PO   VITAMIN D PO     TAKE these medications   benzonatate 200 MG capsule Commonly known as:  TESSALON Take 1 capsule (200 mg total) by mouth 3 (three) times daily as needed for cough.   calcium carbonate 500 MG chewable tablet Commonly known as:  TUMS - dosed in mg elemental calcium Chew 1 tablet (200 mg of elemental calcium total) by mouth 3 (three) times daily.   famotidine 20 MG tablet Commonly known as:  PEPCID Take 1 tablet (20 mg total) by mouth daily.   guaiFENesin 600 MG 12 hr tablet Commonly known as:  MUCINEX Take 1 tablet (600 mg total) by mouth 2 (two) times daily.   ondansetron 4 MG disintegrating tablet Commonly known as:  ZOFRAN ODT Take 1 tablet (4 mg total) by mouth every 8 (eight) hours as needed for nausea or vomiting.   oseltamivir 75 MG capsule Commonly known as:  TAMIFLU Take 1 capsule (75 mg total) by mouth 2 (two) times daily. Last dose on 1/31 morning-and then stop

## 2016-03-02 NOTE — Discharge Summary (Signed)
PATIENT DETAILS Name: Sandy Spencer Age: 31 y.o. Sex: female Date of Birth: 1986-01-08 MRN: [id number]. Admitting Physician: Michael Litter, MD NWG:[id number],HYQMVH NEVILL, MD  Admit Date: 02/29/2016 Discharge date: 03/02/2016  Recommendations for Outpatient Follow-up:  1. Follow up with PCP in 2-3 days 2. Please obtain BMP/CBC at next visit with PCP 3. PTH/vitamin D levels pending-please follow  Admitted From:  Home   Disposition: Home   Home Health: No  Equipment/Devices: None  Discharge Condition: Stable  CODE STATUS: FULL CODE  Diet recommendation:   Regular   Brief Summary: See H&P, Labs, Consult and Test reports for all details in brief, patient is a 31 year old female with no significant past medical history-was diagnosed with influenza-started on Tamiflu-but then started experiencing vomiting, and was brought to the emergency room, where she was found to be hypotensive. She was subsequently admitted for further evaluation and treatment.  Brief Hospital Course: Influenza: Fever curve much better, continues to have some low-grade fever but clinically much improved without any vomiting. She has a completely nontoxic appearance. She is to continue taking Tamiflu-her last dose is on 1/31 morning. She is to follow with her primary M.D. for further continued care. She has been asked to keep herself well hydrated, and use Zofran as needed for vomiting. Please note, 2 view chest x-ray on 1/26 was negative for any infiltrates.  Hypotension: I suspect this was secondary to hypovolemia rather than sepsis. She was given IV fluid hydration, her blood pressure is now back to normal.  Mild pancytopenia: I suspect this is secondary to viral syndrome/influenza. Her CBC is improving, suspect that it will normalize in the next few days. I have asked her to check a CBC at next visit with PCP.  Mild hypokalemia: This is being repleted, recheck lytes at next visit with PCP.  Hypocalcemia:  I suspect that this was secondary influenza-she may have had mild hypomagnesemia (from vomiting) that could have contributed. Calcium is being supplemented. To complete workup I have sent out parathyroid hormone levels and vitamin D levels that are currently pending (will need to be followed by PCP). For now I have kept her on oral calcium supplementation, and I have asked to follow-up with the PCP in the next 2-3 days for repeat chemistry panel check. Please note she is completely asymptomatic.  Note-spoke with patient's mother on the phone on 1/27 and 1/28-with patient's permission.  Procedures/Studies: None  Discharge Diagnoses:  Principal Problem:   Influenza A Active Problems:   Sepsis [facility])   Discharge Instructions:  Activity:  As tolerated   Discharge Instructions    Call MD for:  persistant nausea and vomiting    Complete by:  As directed    Call MD for:  severe uncontrolled pain    Complete by:  As directed    Call MD for:  temperature >100.4    Complete by:  As directed    Diet general    Complete by:  As directed    Discharge instructions    Complete by:  As directed    Follow with Primary MD DR. NEVILLE GATES IN THE NEXT 2-3 DAYS   Your Parathyroid hormone levels and Vit B levels are pending-please ask your Primary MD to follow on these results  Please get a complete blood count and chemistry panel checked by your Primary MD at your next visit, and again as instructed by your Primary MD.  Get Medicines reviewed and adjusted: Please take all your medications with you for your  next visit with your Primary MD  Laboratory/radiological data: Please request your Primary MD to go over all hospital tests and procedure/radiological results at the follow up, please ask your Primary MD to get all Hospital records sent to his/her office.  In some cases, they will be blood work, cultures and biopsy results pending at the time of your discharge. Please request that your  primary care M.D. follows up on these results.  Also Note the following: If you experience worsening of your admission symptoms, develop shortness of breath, life threatening emergency, suicidal or homicidal thoughts you must seek medical attention immediately by calling 911 or calling your MD immediately  if symptoms less severe.  You must read complete instructions/literature along with all the possible adverse reactions/side effects for all the Medicines you take and that have been prescribed to you. Take any new Medicines after you have completely understood and accpet all the possible adverse reactions/side effects.   Do not drive when taking Pain medications or sleeping medications (Benzodaizepines)  Do not take more than prescribed Pain, Sleep and Anxiety Medications. It is not advisable to combine anxiety,sleep and pain medications without talking with your primary care practitioner  Special Instructions: If you have smoked or chewed Tobacco  in the last 2 yrs please stop smoking, stop any regular Alcohol  and or any Recreational drug use.  Wear Seat belts while driving.  Please note: You were cared for by a hospitalist during your hospital stay. Once you are discharged, your primary care physician will handle any further medical issues. Please note that NO REFILLS for any discharge medications will be authorized once you are discharged, as it is imperative that you return to your primary care physician (or establish a relationship with a primary care physician if you do not have one) for your post hospital discharge needs so that they can reassess your need for medications and monitor your lab values.   Increase activity slowly    Complete by:  As directed      Allergies as of 03/02/2016      Reactions   Latex       Medication List    STOP taking these medications   VITAMIN B-1 PO   VITAMIN D PO     TAKE these medications   benzonatate 200 MG capsule Commonly known as:   TESSALON Take 1 capsule (200 mg total) by mouth 3 (three) times daily as needed for cough.   calcium carbonate 500 MG chewable tablet Commonly known as:  TUMS - dosed in mg elemental calcium Chew 1 tablet (200 mg of elemental calcium total) by mouth 3 (three) times daily.   famotidine 20 MG tablet Commonly known as:  PEPCID Take 1 tablet (20 mg total) by mouth daily.   guaiFENesin 600 MG 12 hr tablet Commonly known as:  MUCINEX Take 1 tablet (600 mg total) by mouth 2 (two) times daily.   ondansetron 4 MG disintegrating tablet Commonly known as:  ZOFRAN ODT Take 1 tablet (4 mg total) by mouth every 8 (eight) hours as needed for nausea or vomiting.   oseltamivir 75 MG capsule Commonly known as:  TAMIFLU Take 1 capsule (75 mg total) by mouth 2 (two) times daily. Last dose on 1/31 morning-and then stop      Follow-up Information    GATES,ROBERT NEVILL, MD. Go on 03/03/2016.   Specialty:  Internal Medicine Why:  for repeat CBC Contact information: [address] [place] [address] [phone]  Allergies  Allergen Reactions  . Latex    Consultations:   None  Other Procedures/Studies: Dg Chest 2 View  Result Date: 03/01/2016 CLINICAL DATA:  31 year old female with cough and congestion and fever. EXAM: CHEST  2 VIEW COMPARISON:  Chest radiograph dated 01/08/2012 FINDINGS: Two views of the chest demonstrate clear lungs with sharp costophrenic angles. There is no pneumothorax. The cardiac silhouette is within normal limits. No acute osseous pathology. IMPRESSION: No active cardiopulmonary disease. Electronically Signed   By: Elgie CollardArash  Radparvar M.D.   On: 03/01/2016 00:04     TODAY-DAY OF DISCHARGE:  Subjective:   Sandy Spencer today has no headache,no chest abdominal pain,no new weakness tingling or numbness, feels much better wants to go home today.   Objective:   Blood pressure 97/66, pulse 75, temperature 98.1 F (36.7 C), temperature  source Oral, resp. rate 18, height 5\' 3"  (1.6 m), weight 57.6 kg (126 lb 15.8 oz), last menstrual period 02/17/2016, SpO2 99 %.  Intake/Output Summary (Last 24 hours) at 03/02/16 1051 Last data filed at 03/02/16 0726  Gross per 24 hour  Intake              830 ml  Output                0 ml  Net              830 ml   Filed Weights   02/29/16 2320 03/01/16 0537  Weight: 54.5 kg (120 lb 2 oz) 57.6 kg (126 lb 15.8 oz)    Exam: Awake Alert, Oriented *3, No new F.N deficits, Normal affect [place].AT,PERRAL Supple Neck,No JVD, No cervical lymphadenopathy appriciated.  Symmetrical Chest wall movement, Good air movement bilaterally, CTAB RRR,No Gallops,Rubs or new Murmurs, No Parasternal Heave +ve B.Sounds, Abd Soft, Non tender, No organomegaly appriciated, No rebound -guarding or rigidity. No Cyanosis, Clubbing or edema, No new Rash or bruise   PERTINENT RADIOLOGIC STUDIES: Dg Chest 2 View  Result Date: 03/01/2016 CLINICAL DATA:  31 year old female with cough and congestion and fever. EXAM: CHEST  2 VIEW COMPARISON:  Chest radiograph dated 01/08/2012 FINDINGS: Two views of the chest demonstrate clear lungs with sharp costophrenic angles. There is no pneumothorax. The cardiac silhouette is within normal limits. No acute osseous pathology. IMPRESSION: No active cardiopulmonary disease. Electronically Signed   By: Elgie CollardArash  Radparvar M.D.   On: 03/01/2016 00:04     PERTINENT LAB RESULTS: CBC:  Recent Labs  03/01/16 0613 03/02/16 0404  WBC 3.2* 3.5*  HGB 10.0* 11.1*  HCT 30.6* 34.5*  PLT 84* 100*   CMET CMP     Component Value Date/Time   NA 140 03/02/2016 0404   K 3.3 (L) 03/02/2016 0404   CL 107 03/02/2016 0404   CO2 26 03/02/2016 0404   GLUCOSE 99 03/02/2016 0404   BUN <5 (L) 03/02/2016 0404   CREATININE 0.74 03/02/2016 0404   CALCIUM 8.0 (L) 03/02/2016 0404   PROT 7.8 02/29/2016 2342   ALBUMIN 3.3 (L) 03/02/2016 0404   AST 24 02/29/2016 2342   ALT 16 02/29/2016 2342    ALKPHOS 49 02/29/2016 2342   BILITOT 0.8 02/29/2016 2342   GFRNONAA >60 03/02/2016 0404   GFRAA >60 03/02/2016 0404    GFR Estimated Creatinine Clearance: 86.6 mL/min (by C-G formula based on SCr of 0.74 mg/dL). No results for input(s): LIPASE, AMYLASE in the last 72 hours. No results for input(s): CKTOTAL, CKMB, CKMBINDEX, TROPONINI in the last 72 hours. Invalid input(s): POCBNP  No results for input(s): DDIMER in the last 72 hours. No results for input(s): HGBA1C in the last 72 hours. No results for input(s): CHOL, HDL, LDLCALC, TRIG, CHOLHDL, LDLDIRECT in the last 72 hours. No results for input(s): TSH, T4TOTAL, T3FREE, THYROIDAB in the last 72 hours.  Invalid input(s): FREET3 No results for input(s): VITAMINB12, FOLATE, FERRITIN, TIBC, IRON, RETICCTPCT in the last 72 hours. Coags: No results for input(s): INR in the last 72 hours.  Invalid input(s): PT Microbiology: No results found for this or any previous visit (from the past 240 hour(s)).  FURTHER DISCHARGE INSTRUCTIONS:  Get Medicines reviewed and adjusted: Please take all your medications with you for your next visit with your Primary MD  Laboratory/radiological data: Please request your Primary MD to go over all hospital tests and procedure/radiological results at the follow up, please ask your Primary MD to get all Hospital records sent to his/her office.  In some cases, they will be blood work, cultures and biopsy results pending at the time of your discharge. Please request that your primary care M.D. goes through all the records of your hospital data and follows up on these results.  Also Note the following: If you experience worsening of your admission symptoms, develop shortness of breath, life threatening emergency, suicidal or homicidal thoughts you must seek medical attention immediately by calling 911 or calling your MD immediately  if symptoms less severe.  You must read complete instructions/literature along  with all the possible adverse reactions/side effects for all the Medicines you take and that have been prescribed to you. Take any new Medicines after you have completely understood and accpet all the possible adverse reactions/side effects.   Do not drive when taking Pain medications or sleeping medications (Benzodaizepines)  Do not take more than prescribed Pain, Sleep and Anxiety Medications. It is not advisable to combine anxiety,sleep and pain medications without talking with your primary care practitioner  Special Instructions: If you have smoked or chewed Tobacco  in the last 2 yrs please stop smoking, stop any regular Alcohol  and or any Recreational drug use.  Wear Seat belts while driving.  Please note: You were cared for by a hospitalist during your hospital stay. Once you are discharged, your primary care physician will handle any further medical issues. Please note that NO REFILLS for any discharge medications will be authorized once you are discharged, as it is imperative that you return to your primary care physician (or establish a relationship with a primary care physician if you do not have one) for your post hospital discharge needs so that they can reassess your need for medications and monitor your lab values.  Total Time spent coordinating discharge including counseling, education and face to face time equals 35 minutes.  SignedJeoffrey Massed 03/02/2016 10:51 AM

## 2016-03-02 NOTE — Progress Notes (Signed)
One time dose of potassium chloride 40 mEq ordered for 0600.  Patient refusing this med at this time and asking for it a little later.  Dose was moved to 0700.  Will continue to monitor patient and attempt to give this dose at 0700.

## 2016-03-03 LAB — PARATHYROID HORMONE, INTACT (NO CA): PTH: 15 pg/mL (ref 15–65)

## 2016-03-03 LAB — CALCITRIOL (1,25 DI-OH VIT D): VIT D 1 25 DIHYDROXY: 59.6 pg/mL (ref 19.9–79.3)

## 2016-03-03 LAB — VITAMIN D 25 HYDROXY (VIT D DEFICIENCY, FRACTURES): Vit D, 25-Hydroxy: 24 ng/mL — ABNORMAL LOW (ref 30.0–100.0)

## 2017-06-02 IMAGING — CR DG CHEST 2V
2 series · 2 of 2 positions shown · non-contrast
Comparison: Chest radiograph dated 01/08/2012

CLINICAL DATA: 30-year-old female with cough and congestion and
fever.

EXAM:
CHEST  2 VIEW

[chest pa]
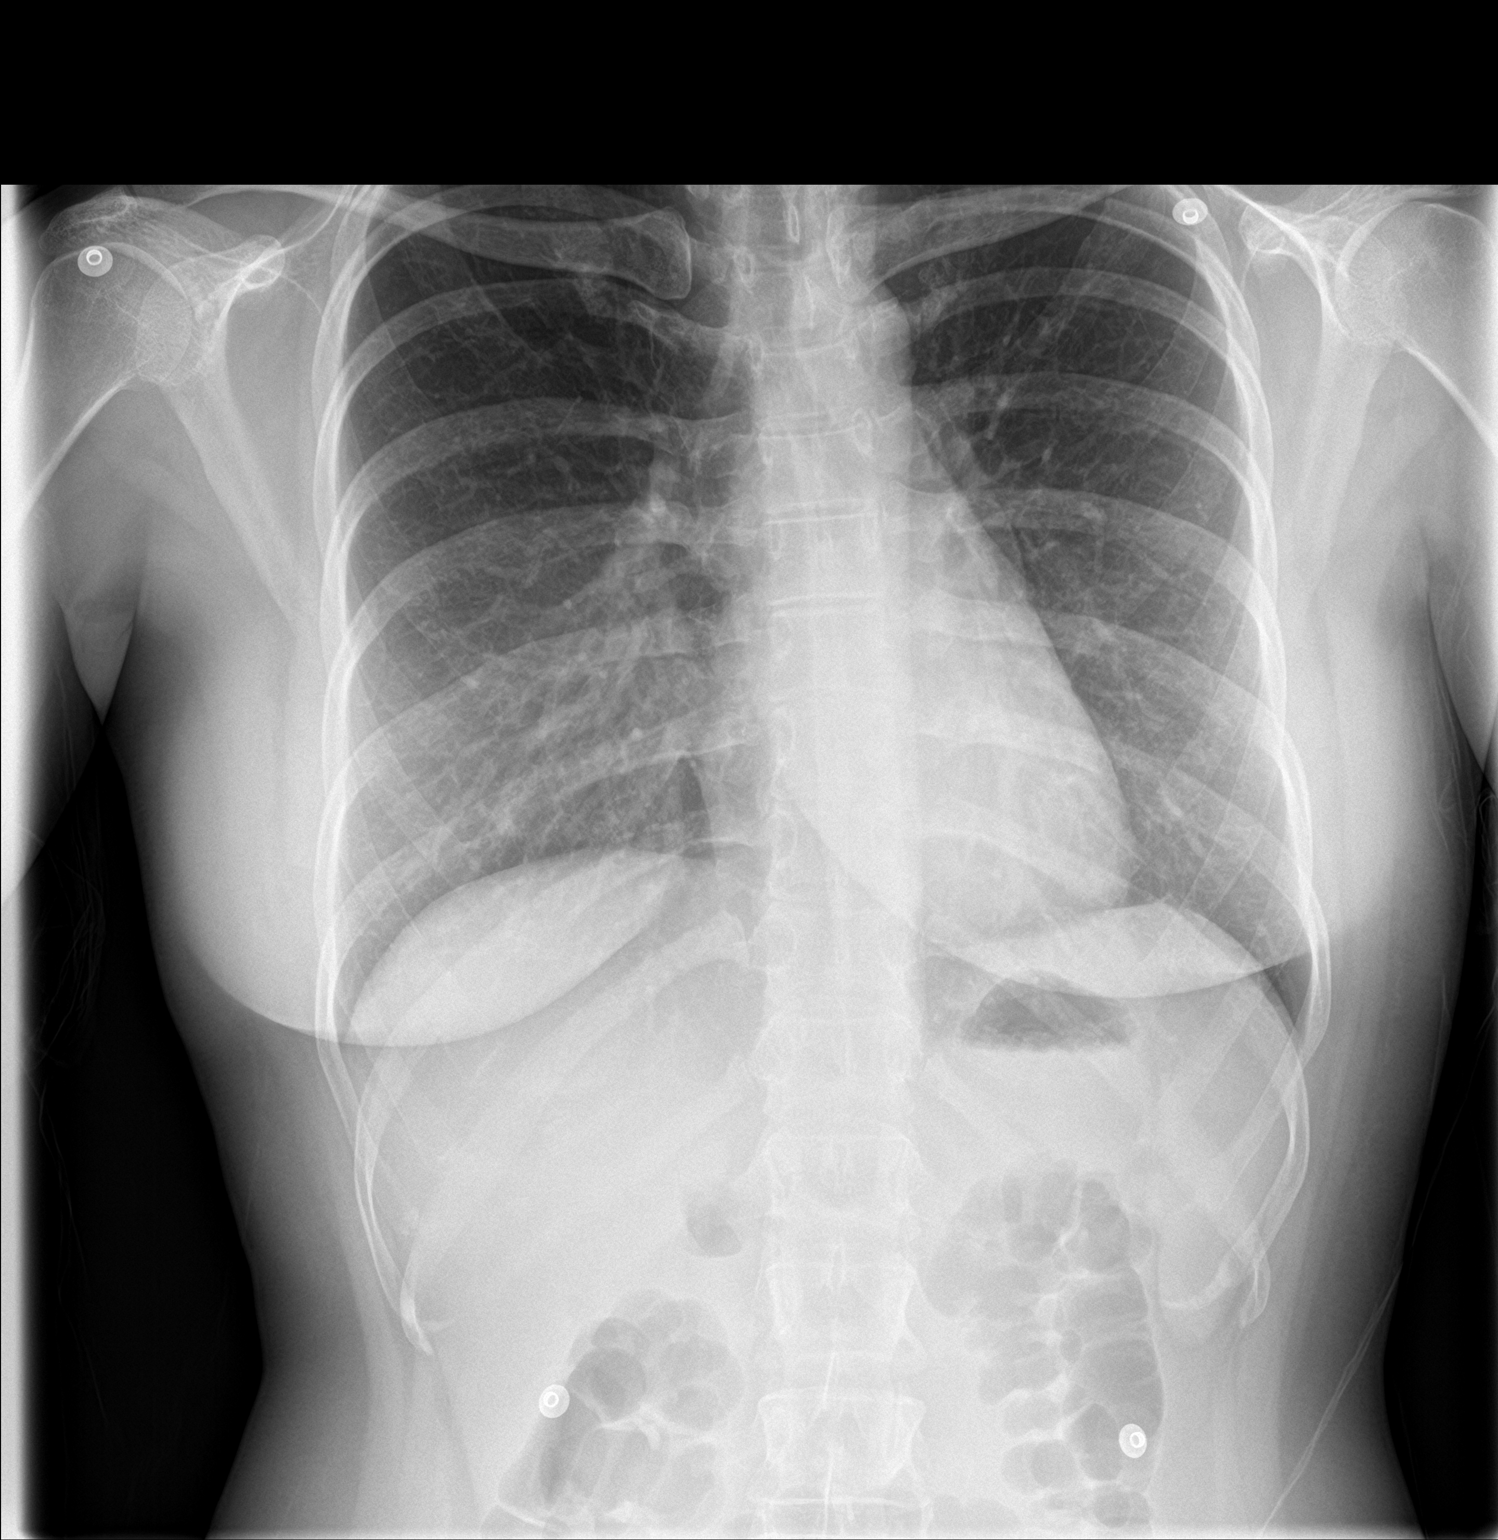

[chest lat]
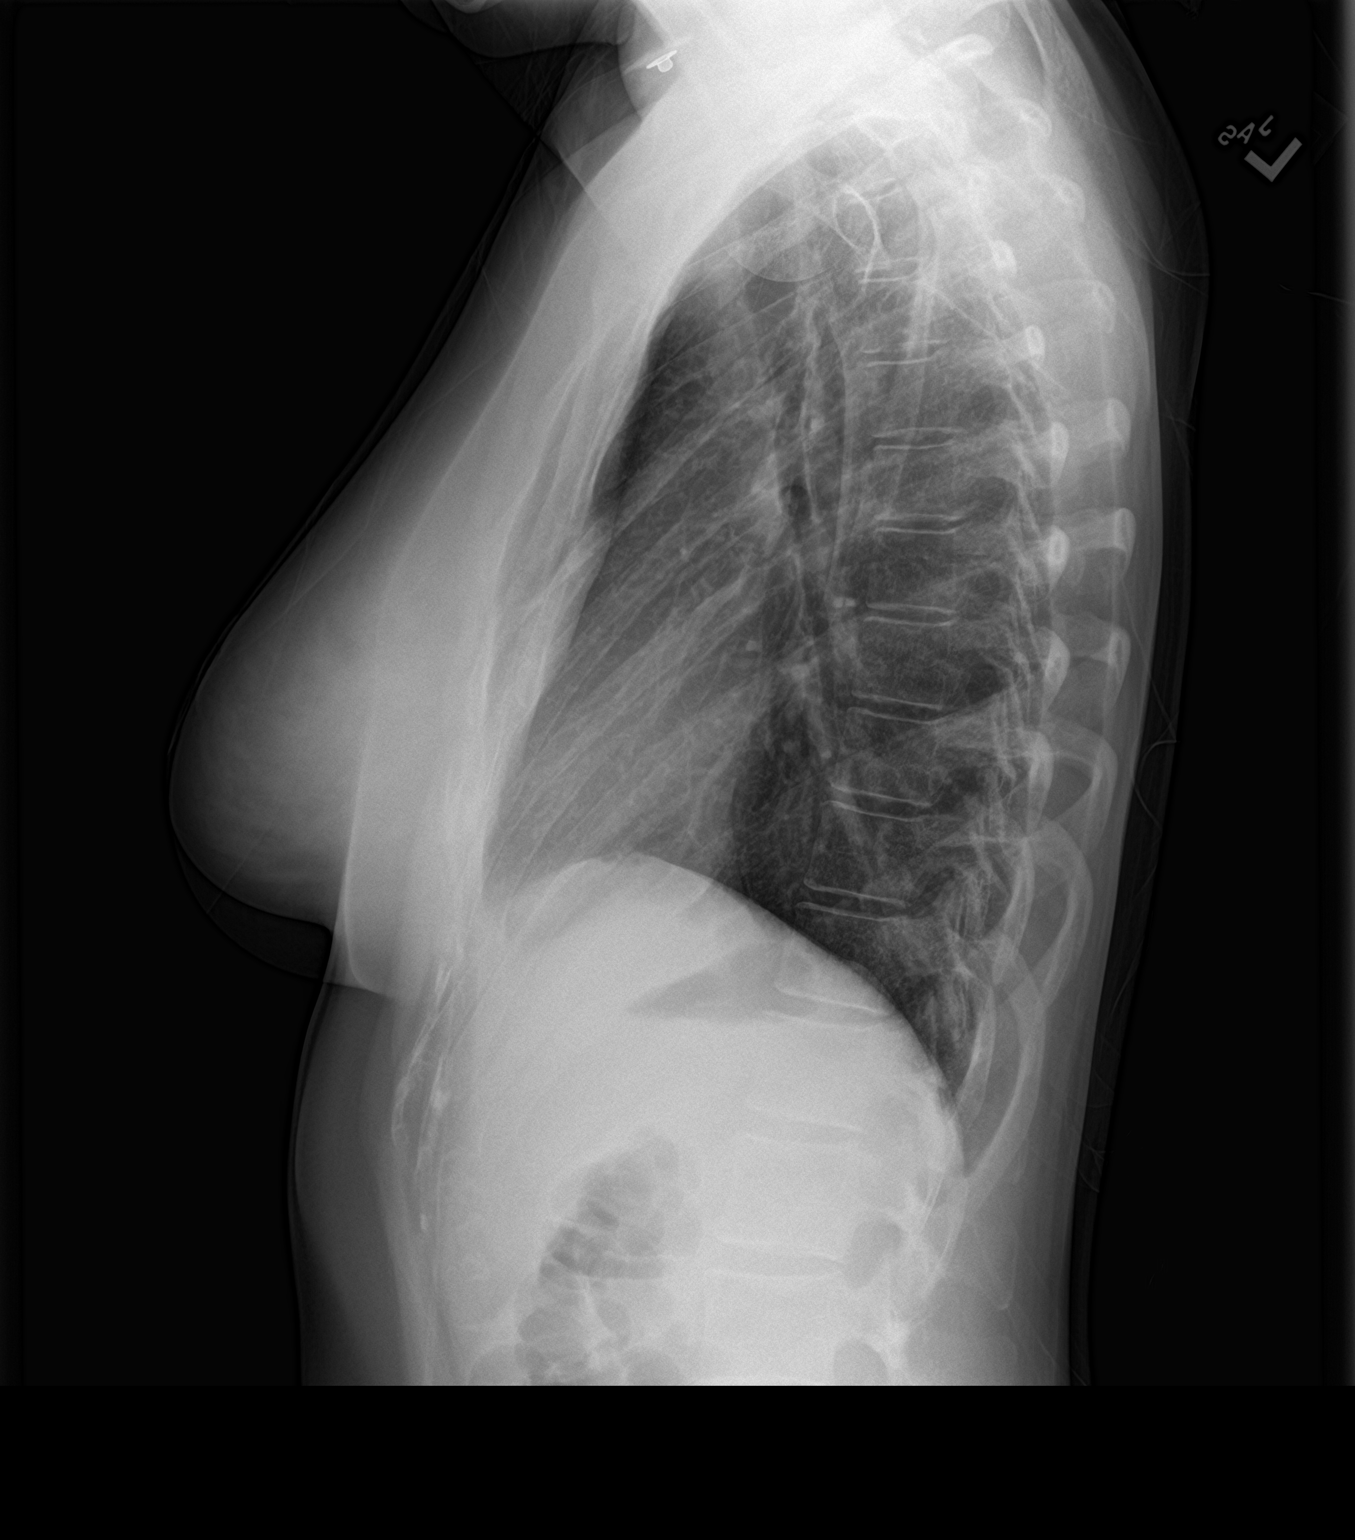

[2 of 2 positions shown; findings below may reference images not displayed]

FINDINGS: Two views of the chest demonstrate clear lungs with sharp
costophrenic angles. There is no pneumothorax. The cardiac
silhouette is within normal limits. No acute osseous pathology.
IMPRESSION: No active cardiopulmonary disease.
